# Patient Record
Sex: Male | Born: 1935 | Race: White | Hispanic: No | Marital: Married
Health system: Southern US, Community
[De-identification: ages and names within clinical notes are randomized; demographics above are authoritative.]

---

## 2008-08-26 ENCOUNTER — Ambulatory Visit: Payer: Self-pay | Admitting: Surgery

## 2008-09-01 ENCOUNTER — Ambulatory Visit: Payer: Self-pay | Admitting: Surgery

## 2008-09-10 ENCOUNTER — Ambulatory Visit: Payer: Self-pay | Admitting: Surgery

## 2017-01-12 ENCOUNTER — Inpatient Hospital Stay
Admission: RE | Admit: 2017-01-12 | Discharge: 2017-02-08 | Disposition: A | Discharge status: Skilled Nursing Facility | Payer: Medicare Other | Source: Ambulatory Visit | Attending: Interventional Radiology | Admitting: Interventional Radiology

## 2017-01-12 ENCOUNTER — Other Ambulatory Visit (HOSPITAL_COMMUNITY): Payer: Medicare Other

## 2017-01-12 DIAGNOSIS — L0291 Cutaneous abscess, unspecified: Secondary | ICD-10-CM

## 2017-01-12 DIAGNOSIS — Z9911 Dependence on respirator [ventilator] status: Secondary | ICD-10-CM

## 2017-01-12 DIAGNOSIS — K567 Ileus, unspecified: Secondary | ICD-10-CM

## 2017-01-12 DIAGNOSIS — Z930 Tracheostomy status: Secondary | ICD-10-CM

## 2017-01-12 DIAGNOSIS — Z4659 Encounter for fitting and adjustment of other gastrointestinal appliance and device: Secondary | ICD-10-CM

## 2017-01-12 DIAGNOSIS — IMO0002 Reserved for concepts with insufficient information to code with codable children: Secondary | ICD-10-CM

## 2017-01-12 DIAGNOSIS — J9690 Respiratory failure, unspecified, unspecified whether with hypoxia or hypercapnia: Secondary | ICD-10-CM

## 2017-01-13 LAB — BLOOD GAS, ARTERIAL
Acid-base deficit: 4.3 mmol/L — ABNORMAL HIGH (ref 0.0–2.0)
Bicarbonate: 20.2 mmol/L (ref 20.0–28.0)
FIO2: 30
O2 SAT: 98.3 %
PATIENT TEMPERATURE: 96.3
PCO2 ART: 34.6 mmHg (ref 32.0–48.0)
PEEP: 5 cmH2O
PH ART: 7.376 (ref 7.350–7.450)
RATE: 16 resp/min
VT: 500 mL
pO2, Arterial: 152 mmHg — ABNORMAL HIGH (ref 83.0–108.0)

## 2017-01-13 LAB — COMPREHENSIVE METABOLIC PANEL
ALBUMIN: 1.7 g/dL — AB (ref 3.5–5.0)
ALK PHOS: 125 U/L (ref 38–126)
ALT: 22 U/L (ref 17–63)
ANION GAP: 10 (ref 5–15)
AST: 29 U/L (ref 15–41)
BUN: 132 mg/dL — ABNORMAL HIGH (ref 6–20)
CALCIUM: 7.7 mg/dL — AB (ref 8.9–10.3)
CO2: 20 mmol/L — AB (ref 22–32)
Chloride: 117 mmol/L — ABNORMAL HIGH (ref 101–111)
Creatinine, Ser: 2.01 mg/dL — ABNORMAL HIGH (ref 0.61–1.24)
GFR calc Af Amer: 35 mL/min — ABNORMAL LOW (ref >60)
GFR calc non Af Amer: 30 mL/min — ABNORMAL LOW (ref >60)
GLUCOSE: 216 mg/dL — AB (ref 65–99)
POTASSIUM: 4.5 mmol/L (ref 3.5–5.1)
SODIUM: 147 mmol/L — AB (ref 135–145)
Total Bilirubin: 1 mg/dL (ref 0.3–1.2)
Total Protein: 5.3 g/dL — ABNORMAL LOW (ref 6.5–8.1)

## 2017-01-13 LAB — CBC WITH DIFFERENTIAL/PLATELET
Basophils Absolute: 0 10*3/uL (ref 0.0–0.1)
Basophils Relative: 0 %
Eosinophils Absolute: 0 10*3/uL (ref 0.0–0.7)
Eosinophils Relative: 0 %
HEMATOCRIT: 25.5 % — AB (ref 39.0–52.0)
Hemoglobin: 8.1 g/dL — ABNORMAL LOW (ref 13.0–17.0)
LYMPHS PCT: 13 %
Lymphs Abs: 1 10*3/uL (ref 0.7–4.0)
MCH: 30.7 pg (ref 26.0–34.0)
MCHC: 31.8 g/dL (ref 30.0–36.0)
MCV: 96.6 fL (ref 78.0–100.0)
MONO ABS: 0.4 10*3/uL (ref 0.1–1.0)
MONOS PCT: 5 %
NEUTROS ABS: 6.5 10*3/uL (ref 1.7–7.7)
Neutrophils Relative %: 82 %
Platelets: 241 10*3/uL (ref 150–400)
RBC: 2.64 MIL/uL — ABNORMAL LOW (ref 4.22–5.81)
RDW: 12.9 % (ref 11.5–15.5)
WBC: 7.9 10*3/uL (ref 4.0–10.5)

## 2017-01-13 LAB — PROTIME-INR
INR: 1.28
Prothrombin Time: 16.1 seconds — ABNORMAL HIGH (ref 11.4–15.2)

## 2017-01-14 ENCOUNTER — Institutional Professional Consult (permissible substitution) (HOSPITAL_COMMUNITY): Payer: Medicare Other

## 2017-01-16 ENCOUNTER — Other Ambulatory Visit (HOSPITAL_COMMUNITY): Payer: Medicare Other

## 2017-01-16 LAB — CBC WITH DIFFERENTIAL/PLATELET
BASOS PCT: 0 %
Basophils Absolute: 0 10*3/uL (ref 0.0–0.1)
EOS ABS: 0 10*3/uL (ref 0.0–0.7)
EOS PCT: 0 %
HCT: 25.5 % — ABNORMAL LOW (ref 39.0–52.0)
HEMOGLOBIN: 7.9 g/dL — AB (ref 13.0–17.0)
LYMPHS ABS: 1 10*3/uL (ref 0.7–4.0)
Lymphocytes Relative: 17 %
MCH: 30.4 pg (ref 26.0–34.0)
MCHC: 31 g/dL (ref 30.0–36.0)
MCV: 98.1 fL (ref 78.0–100.0)
MONO ABS: 0.4 10*3/uL (ref 0.1–1.0)
MONOS PCT: 6 %
Neutro Abs: 4.4 10*3/uL (ref 1.7–7.7)
Neutrophils Relative %: 77 %
Platelets: 214 10*3/uL (ref 150–400)
RBC: 2.6 MIL/uL — ABNORMAL LOW (ref 4.22–5.81)
RDW: 12.4 % (ref 11.5–15.5)
WBC: 5.8 10*3/uL (ref 4.0–10.5)

## 2017-01-16 LAB — BLOOD GAS, ARTERIAL
ACID-BASE EXCESS: 0.8 mmol/L (ref 0.0–2.0)
Bicarbonate: 25 mmol/L (ref 20.0–28.0)
FIO2: 30
MECHVT: 500 mL
O2 SAT: 97.5 %
PEEP/CPAP: 5 cmH2O
PH ART: 7.416 (ref 7.350–7.450)
Patient temperature: 96.9
RATE: 16 resp/min
pCO2 arterial: 39.3 mmHg (ref 32.0–48.0)
pO2, Arterial: 106 mmHg (ref 83.0–108.0)

## 2017-01-16 LAB — BASIC METABOLIC PANEL
Anion gap: 8 (ref 5–15)
BUN: 98 mg/dL — AB (ref 6–20)
CALCIUM: 7.8 mg/dL — AB (ref 8.9–10.3)
CHLORIDE: 119 mmol/L — AB (ref 101–111)
CO2: 26 mmol/L (ref 22–32)
CREATININE: 1.67 mg/dL — AB (ref 0.61–1.24)
GFR calc Af Amer: 43 mL/min — ABNORMAL LOW (ref >60)
GFR calc non Af Amer: 37 mL/min — ABNORMAL LOW (ref >60)
Glucose, Bld: 270 mg/dL — ABNORMAL HIGH (ref 65–99)
Potassium: 3.5 mmol/L (ref 3.5–5.1)
SODIUM: 153 mmol/L — AB (ref 135–145)

## 2017-01-16 LAB — PHOSPHORUS: Phosphorus: 4.7 mg/dL — ABNORMAL HIGH (ref 2.5–4.6)

## 2017-01-16 LAB — MAGNESIUM: MAGNESIUM: 2.4 mg/dL (ref 1.7–2.4)

## 2017-01-17 LAB — BLOOD GAS, ARTERIAL
ACID-BASE EXCESS: 4.9 mmol/L — AB (ref 0.0–2.0)
BICARBONATE: 29.4 mmol/L — AB (ref 20.0–28.0)
FIO2: 28
Mode: POSITIVE
O2 SAT: 96.2 %
PATIENT TEMPERATURE: 98.6
PCO2 ART: 47.2 mmHg (ref 32.0–48.0)
PEEP: 5 cmH2O
PH ART: 7.411 (ref 7.350–7.450)
PO2 ART: 91.7 mmHg (ref 83.0–108.0)
PRESSURE SUPPORT: 8 cmH2O

## 2017-01-17 LAB — RENAL FUNCTION PANEL
ALBUMIN: 1.9 g/dL — AB (ref 3.5–5.0)
ANION GAP: 10 (ref 5–15)
BUN: 88 mg/dL — ABNORMAL HIGH (ref 6–20)
CO2: 29 mmol/L (ref 22–32)
Calcium: 8.1 mg/dL — ABNORMAL LOW (ref 8.9–10.3)
Chloride: 116 mmol/L — ABNORMAL HIGH (ref 101–111)
Creatinine, Ser: 1.58 mg/dL — ABNORMAL HIGH (ref 0.61–1.24)
GFR calc non Af Amer: 40 mL/min — ABNORMAL LOW (ref >60)
GFR, EST AFRICAN AMERICAN: 46 mL/min — AB (ref >60)
GLUCOSE: 202 mg/dL — AB (ref 65–99)
POTASSIUM: 3.9 mmol/L (ref 3.5–5.1)
Phosphorus: 4 mg/dL (ref 2.5–4.6)
Sodium: 155 mmol/L — ABNORMAL HIGH (ref 135–145)

## 2017-01-17 LAB — CBC
HEMATOCRIT: 26.4 % — AB (ref 39.0–52.0)
HEMOGLOBIN: 8 g/dL — AB (ref 13.0–17.0)
MCH: 30 pg (ref 26.0–34.0)
MCHC: 30.3 g/dL (ref 30.0–36.0)
MCV: 98.9 fL (ref 78.0–100.0)
Platelets: 201 10*3/uL (ref 150–400)
RBC: 2.67 MIL/uL — ABNORMAL LOW (ref 4.22–5.81)
RDW: 12.7 % (ref 11.5–15.5)
WBC: 8.1 10*3/uL (ref 4.0–10.5)

## 2017-01-17 LAB — MAGNESIUM: Magnesium: 2.4 mg/dL (ref 1.7–2.4)

## 2017-01-18 LAB — BASIC METABOLIC PANEL
Anion gap: 9 (ref 5–15)
BUN: 80 mg/dL — ABNORMAL HIGH (ref 6–20)
CALCIUM: 7.6 mg/dL — AB (ref 8.9–10.3)
CO2: 31 mmol/L (ref 22–32)
Chloride: 108 mmol/L (ref 101–111)
Creatinine, Ser: 1.44 mg/dL — ABNORMAL HIGH (ref 0.61–1.24)
GFR calc Af Amer: 52 mL/min — ABNORMAL LOW (ref >60)
GFR, EST NON AFRICAN AMERICAN: 45 mL/min — AB (ref >60)
Glucose, Bld: 209 mg/dL — ABNORMAL HIGH (ref 65–99)
Potassium: 3.9 mmol/L (ref 3.5–5.1)
SODIUM: 148 mmol/L — AB (ref 135–145)

## 2017-01-24 ENCOUNTER — Other Ambulatory Visit (HOSPITAL_COMMUNITY): Payer: Medicare Other

## 2017-01-24 LAB — BASIC METABOLIC PANEL
ANION GAP: 11 (ref 5–15)
BUN: 82 mg/dL — AB (ref 6–20)
CHLORIDE: 98 mmol/L — AB (ref 101–111)
CO2: 27 mmol/L (ref 22–32)
Calcium: 7.7 mg/dL — ABNORMAL LOW (ref 8.9–10.3)
Creatinine, Ser: 1.91 mg/dL — ABNORMAL HIGH (ref 0.61–1.24)
GFR calc Af Amer: 37 mL/min — ABNORMAL LOW (ref >60)
GFR calc non Af Amer: 32 mL/min — ABNORMAL LOW (ref >60)
GLUCOSE: 195 mg/dL — AB (ref 65–99)
POTASSIUM: 3.5 mmol/L (ref 3.5–5.1)
Sodium: 136 mmol/L (ref 135–145)

## 2017-01-24 LAB — MAGNESIUM: Magnesium: 2.1 mg/dL (ref 1.7–2.4)

## 2017-01-24 LAB — CBC WITH DIFFERENTIAL/PLATELET
BASOS ABS: 0 10*3/uL (ref 0.0–0.1)
Basophils Relative: 0 %
EOS PCT: 0 %
Eosinophils Absolute: 0 10*3/uL (ref 0.0–0.7)
HEMATOCRIT: 26.2 % — AB (ref 39.0–52.0)
HEMOGLOBIN: 8.3 g/dL — AB (ref 13.0–17.0)
LYMPHS ABS: 0.8 10*3/uL (ref 0.7–4.0)
LYMPHS PCT: 4 %
MCH: 30.7 pg (ref 26.0–34.0)
MCHC: 31.7 g/dL (ref 30.0–36.0)
MCV: 97 fL (ref 78.0–100.0)
Monocytes Absolute: 2 10*3/uL — ABNORMAL HIGH (ref 0.1–1.0)
Monocytes Relative: 10 %
NEUTROS ABS: 16.7 10*3/uL — AB (ref 1.7–7.7)
Neutrophils Relative %: 86 %
PLATELETS: 254 10*3/uL (ref 150–400)
RBC: 2.7 MIL/uL — AB (ref 4.22–5.81)
RDW: 13.7 % (ref 11.5–15.5)
WBC: 19.4 10*3/uL — AB (ref 4.0–10.5)

## 2017-01-24 LAB — PHOSPHORUS: Phosphorus: 4.9 mg/dL — ABNORMAL HIGH (ref 2.5–4.6)

## 2017-01-25 LAB — URINALYSIS, ROUTINE W REFLEX MICROSCOPIC
Bilirubin Urine: NEGATIVE
Glucose, UA: NEGATIVE mg/dL
Hgb urine dipstick: NEGATIVE
Ketones, ur: NEGATIVE mg/dL
Leukocytes, UA: NEGATIVE
Nitrite: NEGATIVE
PH: 5 (ref 5.0–8.0)
Protein, ur: 30 mg/dL — AB
SPECIFIC GRAVITY, URINE: 1.01 (ref 1.005–1.030)

## 2017-01-25 LAB — CBC
HEMATOCRIT: 22.8 % — AB (ref 39.0–52.0)
HEMOGLOBIN: 7.3 g/dL — AB (ref 13.0–17.0)
MCH: 30.7 pg (ref 26.0–34.0)
MCHC: 32 g/dL (ref 30.0–36.0)
MCV: 95.8 fL (ref 78.0–100.0)
Platelets: 195 10*3/uL (ref 150–400)
RBC: 2.38 MIL/uL — AB (ref 4.22–5.81)
RDW: 14 % (ref 11.5–15.5)
WBC: 13.7 10*3/uL — ABNORMAL HIGH (ref 4.0–10.5)

## 2017-01-26 ENCOUNTER — Other Ambulatory Visit (HOSPITAL_COMMUNITY): Payer: Medicare Other

## 2017-01-26 LAB — URINE CULTURE

## 2017-01-26 LAB — CBC WITH DIFFERENTIAL/PLATELET
BASOS PCT: 0 %
Basophils Absolute: 0 10*3/uL (ref 0.0–0.1)
Eosinophils Absolute: 0 10*3/uL (ref 0.0–0.7)
Eosinophils Relative: 0 %
HEMATOCRIT: 25.6 % — AB (ref 39.0–52.0)
HEMOGLOBIN: 7.9 g/dL — AB (ref 13.0–17.0)
LYMPHS ABS: 0.7 10*3/uL (ref 0.7–4.0)
Lymphocytes Relative: 5 %
MCH: 30.3 pg (ref 26.0–34.0)
MCHC: 30.9 g/dL (ref 30.0–36.0)
MCV: 98.1 fL (ref 78.0–100.0)
MONOS PCT: 9 %
Monocytes Absolute: 1.3 10*3/uL — ABNORMAL HIGH (ref 0.1–1.0)
NEUTROS ABS: 12.2 10*3/uL — AB (ref 1.7–7.7)
NEUTROS PCT: 86 %
Platelets: 240 10*3/uL (ref 150–400)
RBC: 2.61 MIL/uL — ABNORMAL LOW (ref 4.22–5.81)
RDW: 13.5 % (ref 11.5–15.5)
WBC: 14.3 10*3/uL — ABNORMAL HIGH (ref 4.0–10.5)

## 2017-01-26 LAB — EXPECTORATED SPUTUM ASSESSMENT W GRAM STAIN, RFLX TO RESP C

## 2017-01-26 LAB — RENAL FUNCTION PANEL
Albumin: 1.5 g/dL — ABNORMAL LOW (ref 3.5–5.0)
Anion gap: 11 (ref 5–15)
BUN: 98 mg/dL — AB (ref 6–20)
CALCIUM: 7.3 mg/dL — AB (ref 8.9–10.3)
CO2: 26 mmol/L (ref 22–32)
CREATININE: 2.03 mg/dL — AB (ref 0.61–1.24)
Chloride: 100 mmol/L — ABNORMAL LOW (ref 101–111)
GFR calc non Af Amer: 29 mL/min — ABNORMAL LOW (ref >60)
GFR, EST AFRICAN AMERICAN: 34 mL/min — AB (ref >60)
Glucose, Bld: 178 mg/dL — ABNORMAL HIGH (ref 65–99)
Phosphorus: 5.6 mg/dL — ABNORMAL HIGH (ref 2.5–4.6)
Potassium: 4 mmol/L (ref 3.5–5.1)
SODIUM: 137 mmol/L (ref 135–145)

## 2017-01-26 LAB — MAGNESIUM: Magnesium: 2 mg/dL (ref 1.7–2.4)

## 2017-01-26 LAB — EXPECTORATED SPUTUM ASSESSMENT W REFEX TO RESP CULTURE

## 2017-01-27 LAB — RENAL FUNCTION PANEL
ANION GAP: 9 (ref 5–15)
Albumin: 1.5 g/dL — ABNORMAL LOW (ref 3.5–5.0)
BUN: 96 mg/dL — ABNORMAL HIGH (ref 6–20)
CHLORIDE: 99 mmol/L — AB (ref 101–111)
CO2: 29 mmol/L (ref 22–32)
Calcium: 7.4 mg/dL — ABNORMAL LOW (ref 8.9–10.3)
Creatinine, Ser: 2.03 mg/dL — ABNORMAL HIGH (ref 0.61–1.24)
GFR calc Af Amer: 34 mL/min — ABNORMAL LOW (ref >60)
GFR calc non Af Amer: 29 mL/min — ABNORMAL LOW (ref >60)
Glucose, Bld: 57 mg/dL — ABNORMAL LOW (ref 65–99)
POTASSIUM: 3.7 mmol/L (ref 3.5–5.1)
Phosphorus: 4.9 mg/dL — ABNORMAL HIGH (ref 2.5–4.6)
Sodium: 137 mmol/L (ref 135–145)

## 2017-01-27 LAB — CBC WITH DIFFERENTIAL/PLATELET
Basophils Absolute: 0 10*3/uL (ref 0.0–0.1)
Basophils Relative: 0 %
EOS ABS: 0.1 10*3/uL (ref 0.0–0.7)
Eosinophils Relative: 1 %
HEMATOCRIT: 25.2 % — AB (ref 39.0–52.0)
HEMOGLOBIN: 7.7 g/dL — AB (ref 13.0–17.0)
LYMPHS ABS: 0.6 10*3/uL — AB (ref 0.7–4.0)
LYMPHS PCT: 5 %
MCH: 29.7 pg (ref 26.0–34.0)
MCHC: 30.6 g/dL (ref 30.0–36.0)
MCV: 97.3 fL (ref 78.0–100.0)
MONOS PCT: 8 %
Monocytes Absolute: 0.8 10*3/uL (ref 0.1–1.0)
NEUTROS ABS: 9.4 10*3/uL — AB (ref 1.7–7.7)
NEUTROS PCT: 86 %
Platelets: 249 10*3/uL (ref 150–400)
RBC: 2.59 MIL/uL — ABNORMAL LOW (ref 4.22–5.81)
RDW: 13.4 % (ref 11.5–15.5)
WBC: 10.8 10*3/uL — ABNORMAL HIGH (ref 4.0–10.5)

## 2017-01-27 LAB — MAGNESIUM: Magnesium: 2.1 mg/dL (ref 1.7–2.4)

## 2017-01-27 NOTE — Consult Note (Signed)
Chief Complaint: Patient was seen in consultation today for abdominal abscess aspiration at the request of Dr Ardeth Sportsman  Referring Physician(s): Dr Ardeth Sportsman  Supervising Physician: Jolaine Click  Patient Status: [facility]                             [facility]  History of Present Illness: Manuel Spencer is a 82 y.o. male   Pt underwent lap appendectomy---to open surgical procedure 4 weeks ago for ruptured appendix Was discharged and did well at home for few days Developed incraesing abd pain; fever Returned to [facility] and was treated for infection Developed PNA and resp distress; sepsis Transferred to [facility] for management  And long term care CT yesterday: IMPRESSION: 1. Lack of IV contrast material diminishes specificity for assessing for abscess. There are 3 suspicious areas of localized fluid in the right hemiabdomen which may reflect multifocal fluid collections/abscess. 2. Persistent right pleural effusion and bibasilar atelectasis 3. Aortic atherosclerosis and infrarenal abdominal aortic ectasia. Ectatic abdominal aorta at risk for aneurysm development. Recommend followup by ultrasound in 5 years. This recommendation follows ACR consensus guidelines: White Paper of the ACR Incidental Findings Committee II on Vascular Findings. J Am Coll Radiol 2013; 10:789-794. 4. Gallstones.  Request for abscess aspiration Approved with Dr Bonnielee Haff Scheduled for 2/17  No past medical history on file.  No past surgical history on file.  Allergies: Patient has no allergy information on record.  Medications: Prior to Admission medications   Not on File     No family history on file.  Social History   Social History  . Marital status: Married    Spouse name: N/A  . Number of children: N/A  . Years of education: N/A   Social History Main Topics  . Smoking status: Not on file  . Smokeless tobacco: Not on file  . Alcohol use Not on file  . Drug  use: Unknown  . Sexual activity: Not on file   Other Topics Concern  . Not on file   Social History Narrative  . No narrative on file    Review of Systems: A 12 point ROS discussed and pertinent positives are indicated in the HPI above.  All other systems are negative.  Review of Systems  Constitutional: Positive for activity change, appetite change and fatigue. Negative for fever.  Respiratory: Negative for shortness of breath.   Neurological: Positive for weakness.  Psychiatric/Behavioral: Negative for behavioral problems.    Vital Signs: There were no vitals taken for this visit.  Physical Exam  Cardiovascular: Normal rate and regular rhythm.   Pulmonary/Chest: Effort normal and breath sounds normal.  Abdominal: Soft. Bowel sounds are normal. There is tenderness.  Musculoskeletal: Normal range of motion.  Neurological: He is alert.  Skin: Skin is warm and dry.  Psychiatric:  Consented with wife at bedside  Nursing note and vitals reviewed.   Mallampati Score:  MD Evaluation Airway: WNL Heart: WNL Abdomen: WNL Chest/ Lungs: WNL ASA  Classification: 3 Mallampati/Airway Score: Two  Imaging: Ct Abdomen Pelvis Wo Contrast  Result Date: 01/26/2017 CLINICAL DATA:  Evaluate for abscess.  Recent appendectomy. EXAM: CT ABDOMEN AND PELVIS WITHOUT CONTRAST TECHNIQUE: Multidetector CT imaging of the abdomen and pelvis was performed following the standard protocol without IV contrast. COMPARISON:  01/12/2017 FINDINGS: Lower chest: Persistent right pleural effusion. Atelectasis is identified within both lung bases. Hepatobiliary: No focal liver abnormality. Stones  are again noted within the gallbladder. The largest measures 1 cm, image 39 of series 2. No biliary dilatation. Pancreas: Unremarkable. No pancreatic ductal dilatation or surrounding inflammatory changes. Spleen: Normal in size without focal abnormality. Adrenals/Urinary Tract: Right adrenal gland appears normal. Left  adrenal nodule measures 1.5 cm, image 34 of series 2. Bilateral kidney cysts are noted which are incompletely characterized without IV contrast. No kidney stone or hydronephrosis. Bilateral renal cortical volume loss is noted. Urinary bladder is normal. Stomach/Bowel: The stomach is normal. The small bowel loops have a normal course and caliber without obstruction. No pathologic dilatation of the colon. Vascular/Lymphatic: Aortic atherosclerosis. Infrarenal abdominal aorta measures 2.6 cm, image 57 of series 2. There is no pelvic or inguinal adenopathy. Reproductive: Mild prostate gland enlargement. Other: There is what is suspected to represent loculated fluid along the undersurface of the right lobe of liver measuring 7.3 by 2.9 by 2.7 cm, image 41 of series 2. A second focus of loculated fluid is identified within the right hemiabdomen measuring 5.3 by 2 points 3 by 6.7 cm. Within the right lower quadrant of the abdomen there is a small focus of fluid measuring 2.7 cm. Musculoskeletal: Degenerative disc disease is identified within the lumbar spine. IMPRESSION: 1. Lack of IV contrast material diminishes specificity for assessing for abscess. There are 3 suspicious areas of localized fluid in the right hemiabdomen which may reflect multifocal fluid collections/abscess. 2. Persistent right pleural effusion and bibasilar atelectasis 3. Aortic atherosclerosis and infrarenal abdominal aortic ectasia. Ectatic abdominal aorta at risk for aneurysm development. Recommend followup by ultrasound in 5 years. This recommendation follows ACR consensus guidelines: White Paper of the ACR Incidental Findings Committee II on Vascular Findings. J Am Coll Radiol 2013; 10:789-794. 4. Gallstones. 5. These results will be called to the ordering clinician or representative by the Radiologist Assistant, and communication documented in the PACS or zVision Dashboard. Electronically Signed   By: Signa Kell M.D.   On: 01/26/2017 16:42    Dg Chest Port 1 View  Result Date: 01/16/2017 CLINICAL DATA:  Ventilator dependent respiratory failure following appendectomy. EXAM: PORTABLE CHEST 1 VIEW COMPARISON:  Portable chest x-ray of January 12, 2017 FINDINGS: The lungs are adequately inflated. The coarse bibasilar lung markings have improved. There is persistent trace left pleural effusion. The cardiac silhouette remains enlarged. The pulmonary vascularity is not engorged. There is calcification in the wall of the aortic arch. The endotracheal tube tip lies 4.1 cm above the carina. The esophagogastric tubes proximal port lies above the GE junction with the tip in the gastric cardia. The right internal jugular venous catheter tip projects over the midportion of the SVC. The bony thorax exhibits no acute abnormality. IMPRESSION: Decreasing bibasilar atelectasis. Persistent small left pleural effusion. Mild cardiomegaly without pulmonary edema. Thoracic aortic atherosclerosis. Advancement of the nasogastric tube by 10 cm is recommended to assure that the proximal port lies below the GE junction. Electronically Signed   By: David  [place] M.D.   On: 01/16/2017 08:05   Dg Chest Port 1 View  Result Date: 01/12/2017 CLINICAL DATA:  Intubated EXAM: PORTABLE CHEST 1 VIEW COMPARISON:  01/07/2017 chest radiograph. FINDINGS: Endotracheal tube tip is 4.3 cm above the carina. Enteric tube enters stomach with the tip not seen on this image. Right internal jugular central venous catheter terminates in the lower third of the superior vena cava. Stable cardiomediastinal silhouette with top-normal heart size and aortic atherosclerosis. No pneumothorax. Small left pleural effusion is stable. No residual right pleural  effusion. No overt pulmonary edema. Patchy bibasilar lung opacities, left greater than right, decreased bilaterally. IMPRESSION: 1. Well-positioned support structures as described. No pneumothorax . 2. Small left pleural effusion, stable. No residual right  pleural effusion. 3. Patchy bibasilar lung opacities, left greater than right, decreased bilaterally. 4. Aortic atherosclerosis. Electronically Signed   By: Delbert PhenixJason A Poff M.D.   On: 01/12/2017 19:23   Dg Abd Portable 1v  Result Date: 01/24/2017 CLINICAL DATA:  Abdominal distension EXAM: PORTABLE ABDOMEN - 1 VIEW COMPARISON:  01/14/2017 FINDINGS: Scattered large and small bowel gas is noted. No obstructive changes are seen. No free air is noted. No bony abnormality is seen. IMPRESSION: No acute abnormality noted. Electronically Signed   By: Alcide CleverMark  Lukens M.D.   On: 01/24/2017 13:22   Dg Abd Portable 1v  Result Date: 01/14/2017 CLINICAL DATA:  Follow up ileus.  Recent appendectomy. EXAM: PORTABLE ABDOMEN - 1 VIEW COMPARISON:  One view abdomen 01/12/2017.  CT 01/12/2017. FINDINGS: Enteric tube tip appears unchanged in the left upper quadrant. There is a small amount of residual contrast material within the colon which is not distended. No extraluminal contrast, bowel wall thickening or significant distention identified. Skin staples are noted from recent abdominal surgery. The bones appear unchanged. IMPRESSION: No active abdominal findings.  No significant bowel distension Electronically Signed   By: Carey BullocksWilliam  Veazey M.D.   On: 01/14/2017 08:39   Dg Abd Portable 1v  Result Date: 01/12/2017 CLINICAL DATA:  Enteric tube placement EXAM: PORTABLE ABDOMEN - 1 VIEW COMPARISON:  01/12/2017 CT abdomen/pelvis FINDINGS: Enteric tube terminates in the proximal stomach with the side port near the esophagogastric junction. No disproportionately dilated small bowel loops. Mild diffuse gaseous distention of the large bowel. Skin staples overlie the sacrum and left lower abdomen. No evidence of pneumatosis or pneumoperitoneum. Mild patchy left lung base opacity and small left pleural effusion. IMPRESSION: 1. Enteric tube terminates in the proximal stomach. 2. Mild diffuse gaseous distention of the large bowel, probably  representing a mild adynamic ileus. Electronically Signed   By: Delbert PhenixJason A Poff M.D.   On: 01/12/2017 19:25    Labs:  CBC:  Recent Labs  01/24/17 1207 01/25/17 0753 01/26/17 0628 01/27/17 0818  WBC 19.4* 13.7* 14.3* 10.8*  HGB 8.3* 7.3* 7.9* 7.7*  HCT 26.2* 22.8* 25.6* 25.2*  PLT 254 195 240 249    COAGS:  Recent Labs  01/13/17 0906  INR 1.28    BMP:  Recent Labs  01/18/17 0615 01/24/17 1207 01/26/17 0628 01/27/17 0818  NA 148* 136 137 137  K 3.9 3.5 4.0 3.7  CL 108 98* 100* 99*  CO2 31 27 26 29   GLUCOSE 209* 195* 178* 57*  BUN 80* 82* 98* 96*  CALCIUM 7.6* 7.7* 7.3* 7.4*  CREATININE 1.44* 1.91* 2.03* 2.03*  GFRNONAA 45* 32* 29* 29*  GFRAA 52* 37* 34* 34*    LIVER FUNCTION TESTS:  Recent Labs  01/13/17 0906 01/17/17 0600 01/26/17 0628 01/27/17 0818  BILITOT 1.0  --   --   --   AST 29  --   --   --   ALT 22  --   --   --   ALKPHOS 125  --   --   --   PROT 5.3*  --   --   --   ALBUMIN 1.7* 1.9* 1.5* 1.5*    TUMOR MARKERS: No results for input(s): AFPTM, CEA, CA199, CHROMGRNA in the last 8760 hours.  Assessment and Plan:  Appendectomy  for ruptured appendix 4 weeks ago Now with ongoing abd pain CT revealing 3 collections in abd Scheduled for aspiration in IR 2/17 Risks and Benefits discussed with the patient and wife including bleeding, infection, damage to adjacent structures, and sepsis. All of the patient's questions were answered, patient is agreeable to proceed. Consent signed and in chart.   Thank you for this interesting consult.  I greatly enjoyed meeting SAMUEL MCPEEK and look forward to participating in their care.  A copy of this report was sent to the requesting provider on this date.  Electronically Signed: Ralene Muskrat A 01/27/2017, 4:12 PM   I spent a total of 40 Minutes    in face to face in clinical consultation, greater than 50% of which was counseling/coordinating care for abdominal abscess aspiration

## 2017-01-28 ENCOUNTER — Encounter: Payer: Self-pay | Admitting: *Deleted

## 2017-01-28 ENCOUNTER — Other Ambulatory Visit (HOSPITAL_COMMUNITY): Payer: Medicare Other

## 2017-01-28 LAB — PROTIME-INR
INR: 1.14
Prothrombin Time: 14.6 seconds (ref 11.4–15.2)

## 2017-01-28 LAB — CBC
HCT: 24.5 % — ABNORMAL LOW (ref 39.0–52.0)
Hemoglobin: 7.6 g/dL — ABNORMAL LOW (ref 13.0–17.0)
MCH: 30.2 pg (ref 26.0–34.0)
MCHC: 31 g/dL (ref 30.0–36.0)
MCV: 97.2 fL (ref 78.0–100.0)
Platelets: 246 10*3/uL (ref 150–400)
RBC: 2.52 MIL/uL — AB (ref 4.22–5.81)
RDW: 13.6 % (ref 11.5–15.5)
WBC: 10 10*3/uL (ref 4.0–10.5)

## 2017-01-28 LAB — BASIC METABOLIC PANEL
Anion gap: 11 (ref 5–15)
BUN: 84 mg/dL — AB (ref 6–20)
CO2: 28 mmol/L (ref 22–32)
CREATININE: 1.79 mg/dL — AB (ref 0.61–1.24)
Calcium: 7.3 mg/dL — ABNORMAL LOW (ref 8.9–10.3)
Chloride: 99 mmol/L — ABNORMAL LOW (ref 101–111)
GFR calc Af Amer: 40 mL/min — ABNORMAL LOW (ref >60)
GFR calc non Af Amer: 34 mL/min — ABNORMAL LOW (ref >60)
GLUCOSE: 108 mg/dL — AB (ref 65–99)
POTASSIUM: 3.5 mmol/L (ref 3.5–5.1)
Sodium: 138 mmol/L (ref 135–145)

## 2017-01-28 LAB — URINALYSIS, ROUTINE W REFLEX MICROSCOPIC
BILIRUBIN URINE: NEGATIVE
GLUCOSE, UA: NEGATIVE mg/dL
KETONES UR: NEGATIVE mg/dL
LEUKOCYTES UA: NEGATIVE
Nitrite: NEGATIVE
PH: 5 (ref 5.0–8.0)
PROTEIN: NEGATIVE mg/dL
SQUAMOUS EPITHELIAL / LPF: NONE SEEN
Specific Gravity, Urine: 1.008 (ref 1.005–1.030)

## 2017-01-28 LAB — APTT: APTT: 47 s — AB (ref 24–36)

## 2017-01-28 LAB — MAGNESIUM: Magnesium: 2.1 mg/dL (ref 1.7–2.4)

## 2017-01-28 MED ORDER — SODIUM CHLORIDE 0.9 % IV SOLN
INTRAVENOUS | Status: AC | PRN
  Administered 2017-01-28: 10 mL/h via INTRAVENOUS

## 2017-01-28 MED ORDER — FENTANYL CITRATE (PF) 100 MCG/2ML IJ SOLN
INTRAMUSCULAR | Status: AC
  Filled 2017-01-28: qty 2

## 2017-01-28 MED ORDER — MIDAZOLAM HCL 2 MG/2ML IJ SOLN
INTRAMUSCULAR | Status: AC | PRN
  Administered 2017-01-28: 1 mg via INTRAVENOUS

## 2017-01-28 MED ORDER — FENTANYL CITRATE (PF) 100 MCG/2ML IJ SOLN
INTRAMUSCULAR | Status: AC | PRN
  Administered 2017-01-28 (×2): 25 ug via INTRAVENOUS

## 2017-01-28 MED ORDER — LIDOCAINE HCL 1 % IJ SOLN
INTRAMUSCULAR | Status: AC
  Filled 2017-01-28: qty 20

## 2017-01-28 MED ORDER — MIDAZOLAM HCL 2 MG/2ML IJ SOLN
INTRAMUSCULAR | Status: AC
  Filled 2017-01-28: qty 2

## 2017-01-28 NOTE — Sedation Documentation (Signed)
Transferred pt to 5E21 on CR monitor. Awake and alert. In no distress. Sa02 96-97%.

## 2017-01-28 NOTE — CV Procedure (Signed)
R abd fluid collection aspiration Clear fluid aspirated No comp/EBL

## 2017-01-29 LAB — CULTURE, RESPIRATORY

## 2017-01-29 LAB — URINE CULTURE

## 2017-01-29 LAB — CULTURE, RESPIRATORY W GRAM STAIN

## 2017-01-31 LAB — POTASSIUM: Potassium: 4 mmol/L (ref 3.5–5.1)

## 2017-01-31 LAB — BASIC METABOLIC PANEL
Anion gap: 8 (ref 5–15)
BUN: 49 mg/dL — ABNORMAL HIGH (ref 6–20)
CALCIUM: 7.3 mg/dL — AB (ref 8.9–10.3)
CHLORIDE: 102 mmol/L (ref 101–111)
CO2: 28 mmol/L (ref 22–32)
CREATININE: 1.43 mg/dL — AB (ref 0.61–1.24)
GFR calc non Af Amer: 45 mL/min — ABNORMAL LOW (ref >60)
GFR, EST AFRICAN AMERICAN: 52 mL/min — AB (ref >60)
GLUCOSE: 161 mg/dL — AB (ref 65–99)
Potassium: 3.4 mmol/L — ABNORMAL LOW (ref 3.5–5.1)
Sodium: 138 mmol/L (ref 135–145)

## 2017-01-31 LAB — URINE CULTURE

## 2017-02-02 LAB — AEROBIC/ANAEROBIC CULTURE W GRAM STAIN (SURGICAL/DEEP WOUND)
Culture: NO GROWTH
Gram Stain: NONE SEEN

## 2017-02-03 LAB — BASIC METABOLIC PANEL
ANION GAP: 7 (ref 5–15)
BUN: 36 mg/dL — ABNORMAL HIGH (ref 6–20)
CO2: 29 mmol/L (ref 22–32)
Calcium: 7.7 mg/dL — ABNORMAL LOW (ref 8.9–10.3)
Chloride: 102 mmol/L (ref 101–111)
Creatinine, Ser: 1.42 mg/dL — ABNORMAL HIGH (ref 0.61–1.24)
GFR calc Af Amer: 53 mL/min — ABNORMAL LOW (ref >60)
GFR, EST NON AFRICAN AMERICAN: 45 mL/min — AB (ref >60)
Glucose, Bld: 255 mg/dL — ABNORMAL HIGH (ref 65–99)
POTASSIUM: 4.6 mmol/L (ref 3.5–5.1)
SODIUM: 138 mmol/L (ref 135–145)

## 2017-02-03 LAB — CBC
HCT: 26 % — ABNORMAL LOW (ref 39.0–52.0)
Hemoglobin: 8 g/dL — ABNORMAL LOW (ref 13.0–17.0)
MCH: 30.2 pg (ref 26.0–34.0)
MCHC: 30.8 g/dL (ref 30.0–36.0)
MCV: 98.1 fL (ref 78.0–100.0)
PLATELETS: 226 10*3/uL (ref 150–400)
RBC: 2.65 MIL/uL — AB (ref 4.22–5.81)
RDW: 14.1 % (ref 11.5–15.5)
WBC: 14.8 10*3/uL — AB (ref 4.0–10.5)

## 2017-02-05 LAB — CBC
HCT: 26.6 % — ABNORMAL LOW (ref 39.0–52.0)
HEMOGLOBIN: 8.2 g/dL — AB (ref 13.0–17.0)
MCH: 30.1 pg (ref 26.0–34.0)
MCHC: 30.8 g/dL (ref 30.0–36.0)
MCV: 97.8 fL (ref 78.0–100.0)
Platelets: 228 10*3/uL (ref 150–400)
RBC: 2.72 MIL/uL — AB (ref 4.22–5.81)
RDW: 14.5 % (ref 11.5–15.5)
WBC: 13.6 10*3/uL — AB (ref 4.0–10.5)

## 2017-02-05 LAB — BASIC METABOLIC PANEL
ANION GAP: 6 (ref 5–15)
BUN: 31 mg/dL — ABNORMAL HIGH (ref 6–20)
CHLORIDE: 100 mmol/L — AB (ref 101–111)
CO2: 31 mmol/L (ref 22–32)
Calcium: 8 mg/dL — ABNORMAL LOW (ref 8.9–10.3)
Creatinine, Ser: 1.28 mg/dL — ABNORMAL HIGH (ref 0.61–1.24)
GFR calc non Af Amer: 52 mL/min — ABNORMAL LOW (ref >60)
GFR, EST AFRICAN AMERICAN: 60 mL/min — AB (ref >60)
Glucose, Bld: 228 mg/dL — ABNORMAL HIGH (ref 65–99)
POTASSIUM: 4.6 mmol/L (ref 3.5–5.1)
SODIUM: 137 mmol/L (ref 135–145)

## 2017-02-06 ENCOUNTER — Other Ambulatory Visit (HOSPITAL_COMMUNITY): Payer: Medicare Other

## 2017-02-07 LAB — RENAL FUNCTION PANEL
Albumin: 1.7 g/dL — ABNORMAL LOW (ref 3.5–5.0)
Anion gap: 9 (ref 5–15)
BUN: 31 mg/dL — ABNORMAL HIGH (ref 6–20)
CHLORIDE: 99 mmol/L — AB (ref 101–111)
CO2: 28 mmol/L (ref 22–32)
Calcium: 8.1 mg/dL — ABNORMAL LOW (ref 8.9–10.3)
Creatinine, Ser: 1.32 mg/dL — ABNORMAL HIGH (ref 0.61–1.24)
GFR calc Af Amer: 58 mL/min — ABNORMAL LOW (ref >60)
GFR, EST NON AFRICAN AMERICAN: 50 mL/min — AB (ref >60)
Glucose, Bld: 222 mg/dL — ABNORMAL HIGH (ref 65–99)
POTASSIUM: 4.9 mmol/L (ref 3.5–5.1)
Phosphorus: 4.2 mg/dL (ref 2.5–4.6)
Sodium: 136 mmol/L (ref 135–145)

## 2017-02-08 LAB — RENAL FUNCTION PANEL
ALBUMIN: 1.7 g/dL — AB (ref 3.5–5.0)
ANION GAP: 10 (ref 5–15)
BUN: 31 mg/dL — ABNORMAL HIGH (ref 6–20)
CO2: 26 mmol/L (ref 22–32)
Calcium: 7.9 mg/dL — ABNORMAL LOW (ref 8.9–10.3)
Chloride: 102 mmol/L (ref 101–111)
Creatinine, Ser: 1.38 mg/dL — ABNORMAL HIGH (ref 0.61–1.24)
GFR calc Af Amer: 55 mL/min — ABNORMAL LOW (ref >60)
GFR, EST NON AFRICAN AMERICAN: 47 mL/min — AB (ref >60)
Glucose, Bld: 176 mg/dL — ABNORMAL HIGH (ref 65–99)
PHOSPHORUS: 4 mg/dL (ref 2.5–4.6)
POTASSIUM: 4.4 mmol/L (ref 3.5–5.1)
Sodium: 138 mmol/L (ref 135–145)

## 2017-02-08 LAB — CBC WITH DIFFERENTIAL/PLATELET
BASOS ABS: 0 10*3/uL (ref 0.0–0.1)
Basophils Relative: 0 %
Eosinophils Absolute: 0.2 10*3/uL (ref 0.0–0.7)
Eosinophils Relative: 3 %
HEMATOCRIT: 25.6 % — AB (ref 39.0–52.0)
Hemoglobin: 7.8 g/dL — ABNORMAL LOW (ref 13.0–17.0)
LYMPHS PCT: 14 %
Lymphs Abs: 1.2 10*3/uL (ref 0.7–4.0)
MCH: 30 pg (ref 26.0–34.0)
MCHC: 30.5 g/dL (ref 30.0–36.0)
MCV: 98.5 fL (ref 78.0–100.0)
MONO ABS: 0.7 10*3/uL (ref 0.1–1.0)
Monocytes Relative: 8 %
NEUTROS ABS: 6.2 10*3/uL (ref 1.7–7.7)
Neutrophils Relative %: 75 %
Platelets: 220 10*3/uL (ref 150–400)
RBC: 2.6 MIL/uL — AB (ref 4.22–5.81)
RDW: 14.9 % (ref 11.5–15.5)
WBC: 8.3 10*3/uL (ref 4.0–10.5)

## 2017-02-08 LAB — MAGNESIUM: Magnesium: 1.8 mg/dL (ref 1.7–2.4)

## 2017-05-23 IMAGING — CT CT ABD-PELV W/O CM
2 of 4 series · 15 of 46 positions shown, 17 images · non-contrast
Comparison: 01/12/2017

CLINICAL DATA: Evaluate for abscess.  Recent appendectomy.

EXAM:
CT ABDOMEN AND PELVIS WITHOUT CONTRAST
TECHNIQUE: Multidetector CT imaging of the abdomen and pelvis was performed
following the standard protocol without IV contrast.

[Series 2: a/p w/o 5mm · axial · non-contrast · 0.89mm/px · z∈[+952,+1432]mm · 12 of 114 slices shown, 14 images]
[im 9/114  soft-tissue]
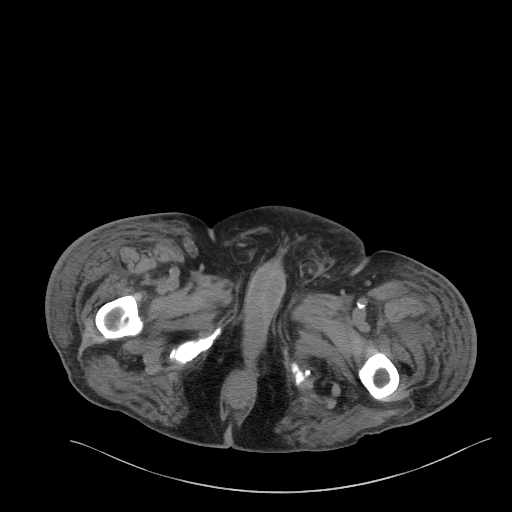
[im 9/114  bone]
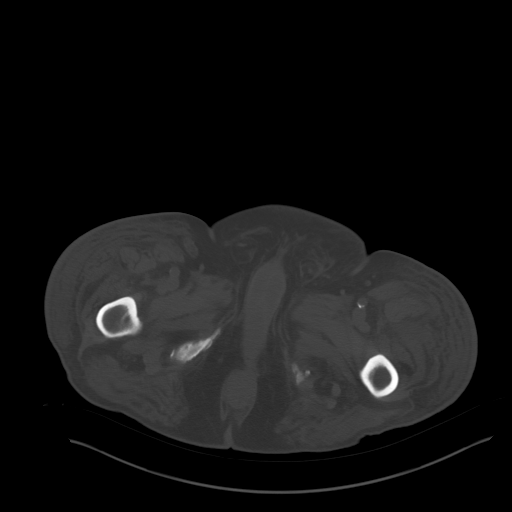
[im 18/114  soft-tissue]
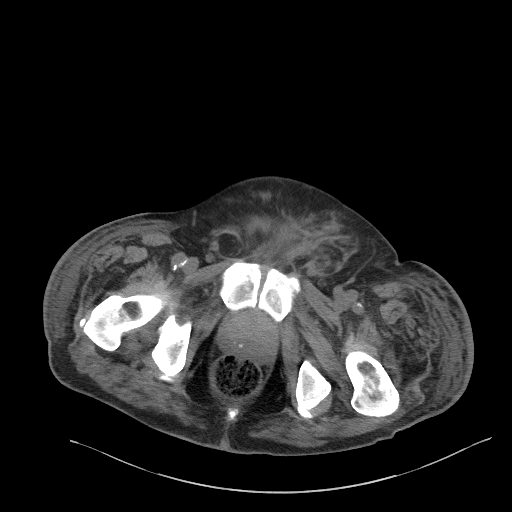
[im 27/114  soft-tissue]
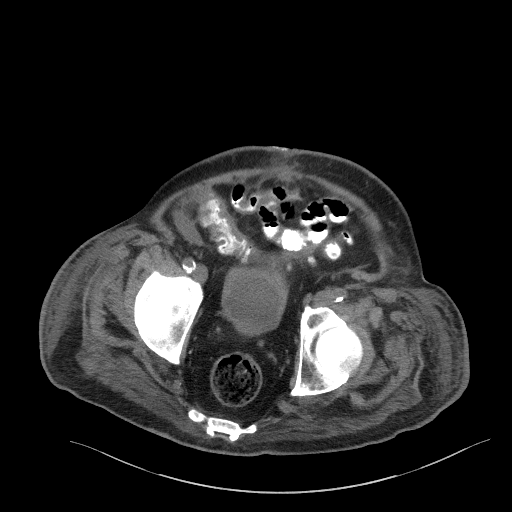
[im 35/114  soft-tissue]
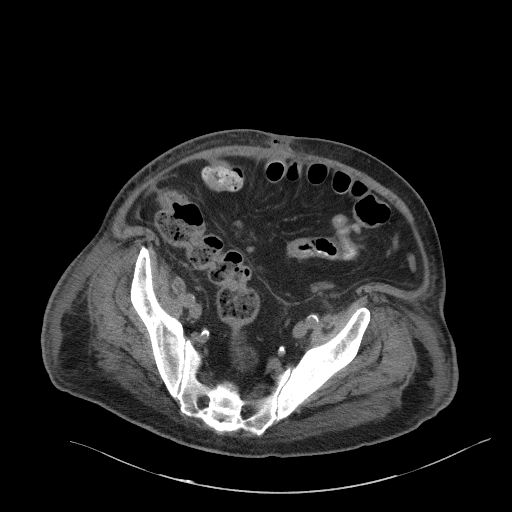
[im 44/114  soft-tissue]
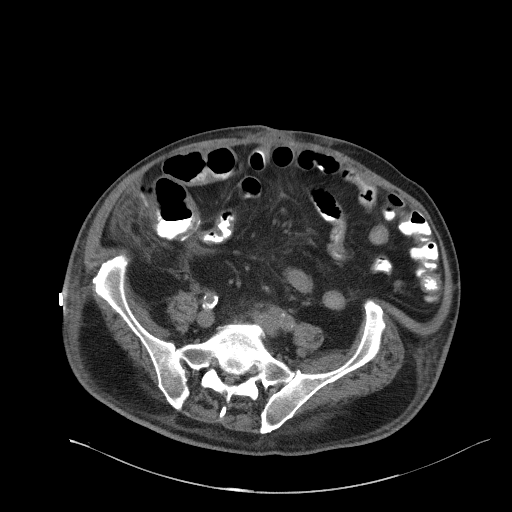
[im 53/114  soft-tissue]
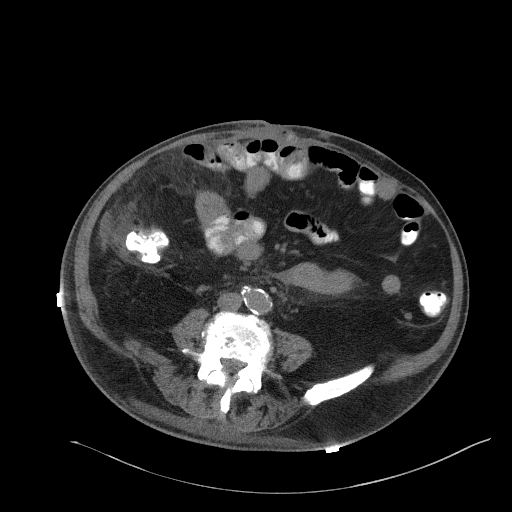
[im 61/114  soft-tissue]
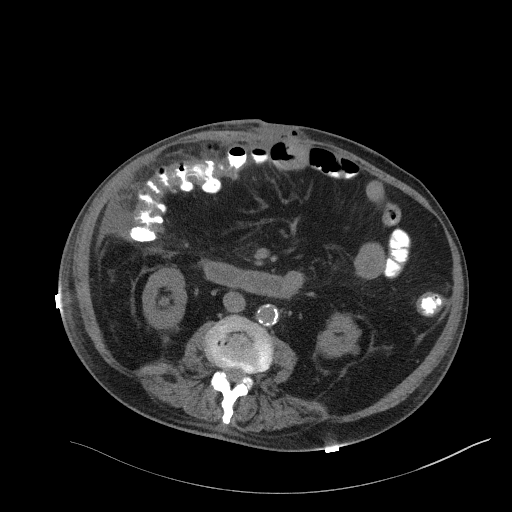
[im 70/114  soft-tissue]
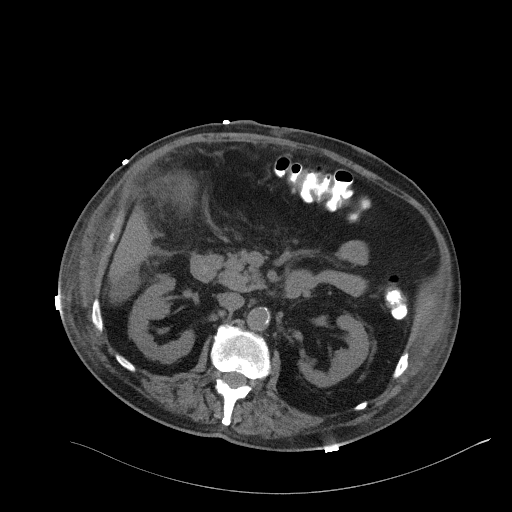
[im 79/114  soft-tissue]
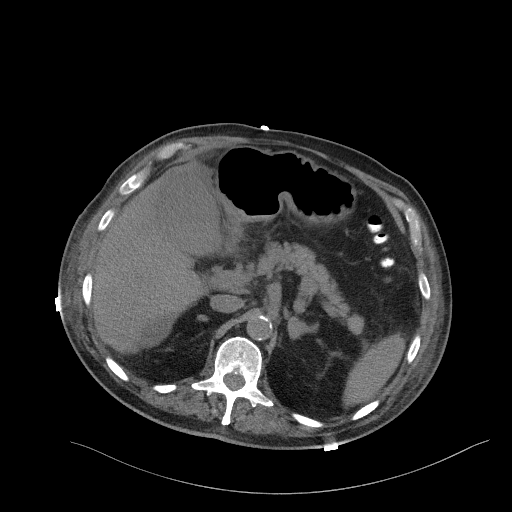
[im 79/114  bone]
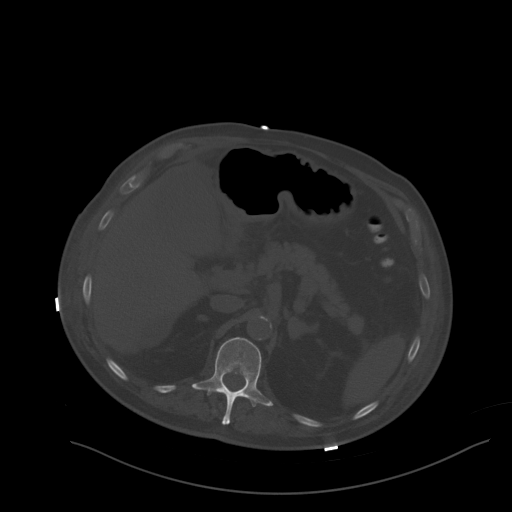
[im 87/114  soft-tissue]
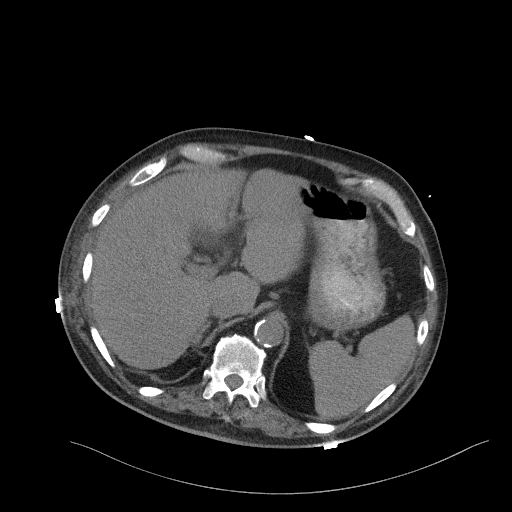
[im 96/114  soft-tissue]
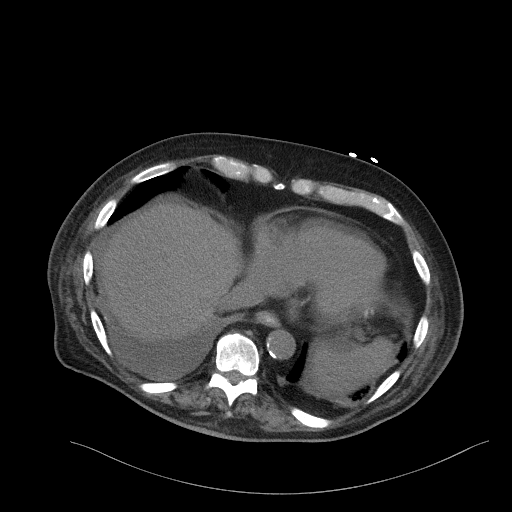
[im 105/114  soft-tissue]
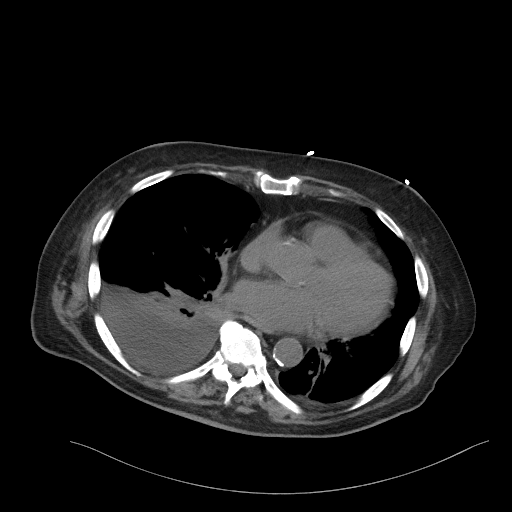

[Series 4: a/p w/o cor · coronal · non-contrast · 0.83mm/px · 3 of 145 slices shown]
[im 49/145  soft-tissue]
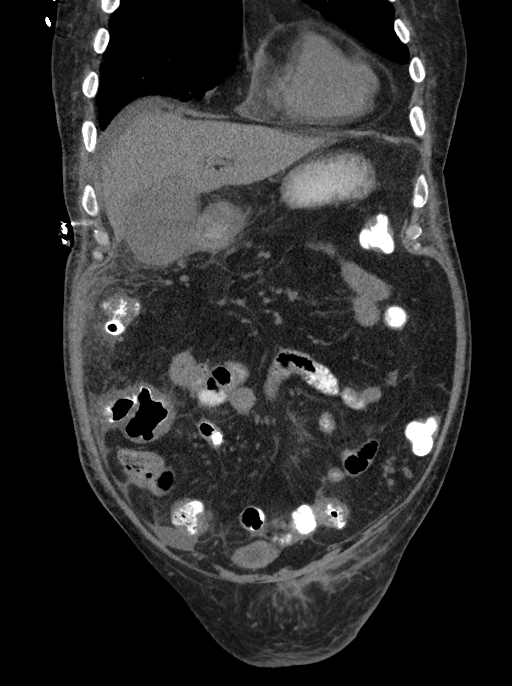
[im 65/145  soft-tissue]
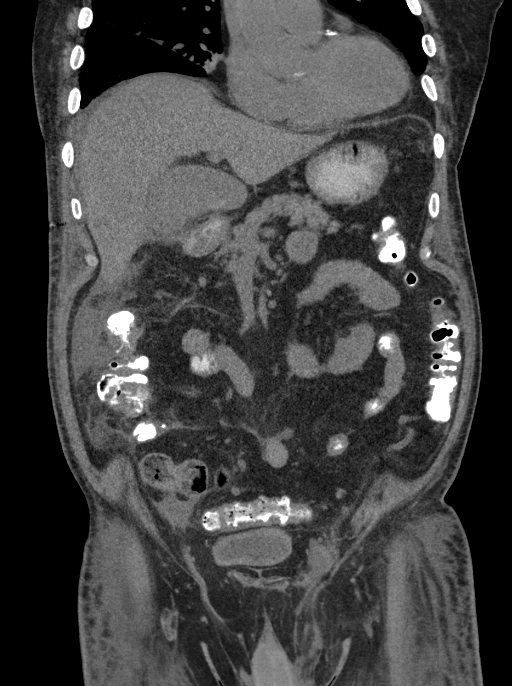
[im 81/145  soft-tissue]
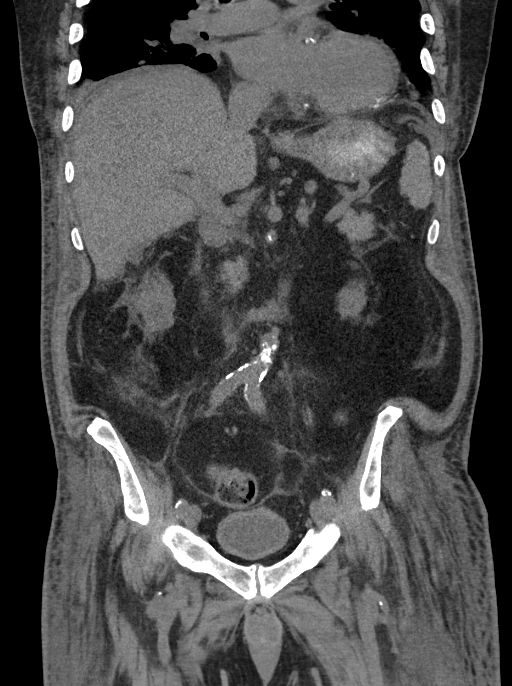

[15 of 46 positions shown; findings below may reference images not displayed]

FINDINGS: Lower chest: Persistent right pleural effusion. Atelectasis is
identified within both lung bases.

Hepatobiliary: No focal liver abnormality. Stones are again noted
within the gallbladder. The largest measures 1 cm, image 39 of
series 2. No biliary dilatation.

Pancreas: Unremarkable. No pancreatic ductal dilatation or
surrounding inflammatory changes.

Spleen: Normal in size without focal abnormality.

Adrenals/Urinary Tract: Right adrenal gland appears normal. Left
adrenal nodule measures 1.5 cm, image 34 of series 2. Bilateral
kidney cysts are noted which are incompletely characterized without
IV contrast. No kidney stone or hydronephrosis. Bilateral renal
cortical volume loss is noted. Urinary bladder is normal.

Stomach/Bowel: The stomach is normal. The small bowel loops have a
normal course and caliber without obstruction. No pathologic
dilatation of the colon.

Vascular/Lymphatic: Aortic atherosclerosis. Infrarenal abdominal
aorta measures 2.6 cm, image 57 of series 2. There is no pelvic or
inguinal adenopathy.

Reproductive: Mild prostate gland enlargement.

Other: There is what is suspected to represent loculated fluid along
the undersurface of the right lobe of liver measuring 7.3 by 2.9 by
2.7 cm, image 41 of series 2. A second focus of loculated fluid is
identified within the right hemiabdomen measuring 5.3 by 2 points 3
by 6.7 cm. Within the right lower quadrant of the abdomen there is a
small focus of fluid measuring 2.7 cm.

Musculoskeletal: Degenerative disc disease is identified within the
lumbar spine.
IMPRESSION: 1. Lack of IV contrast material diminishes specificity for assessing
for abscess. There are 3 suspicious areas of localized fluid in the
right hemiabdomen which may reflect multifocal fluid
collections/abscess.
2. Persistent right pleural effusion and bibasilar atelectasis
3. Aortic atherosclerosis and infrarenal abdominal aortic ectasia.
Ectatic abdominal aorta at risk for aneurysm development. Recommend
followup by ultrasound in 5 years. This recommendation follows ACR
consensus guidelines: White Paper of the ACR Incidental Findings
Committee II on Vascular Findings. [HOSPITAL] [id number];
[DATE].
4. Gallstones.
5. These results will be called to the ordering clinician or
representative by the Radiologist Assistant, and communication
documented in the PACS or zVision Dashboard.

## 2017-06-03 IMAGING — DX DG ABD PORTABLE 1V
1 series · 1 of 1 positions shown · non-contrast
Comparison: CT abdomen and pelvis 01/26/2017. Abdominal radiograph
01/24/2017.

CLINICAL DATA: Ileus.  Abdominal distention.

EXAM:
PORTABLE ABDOMEN - 1 VIEW

[abdomen kub]
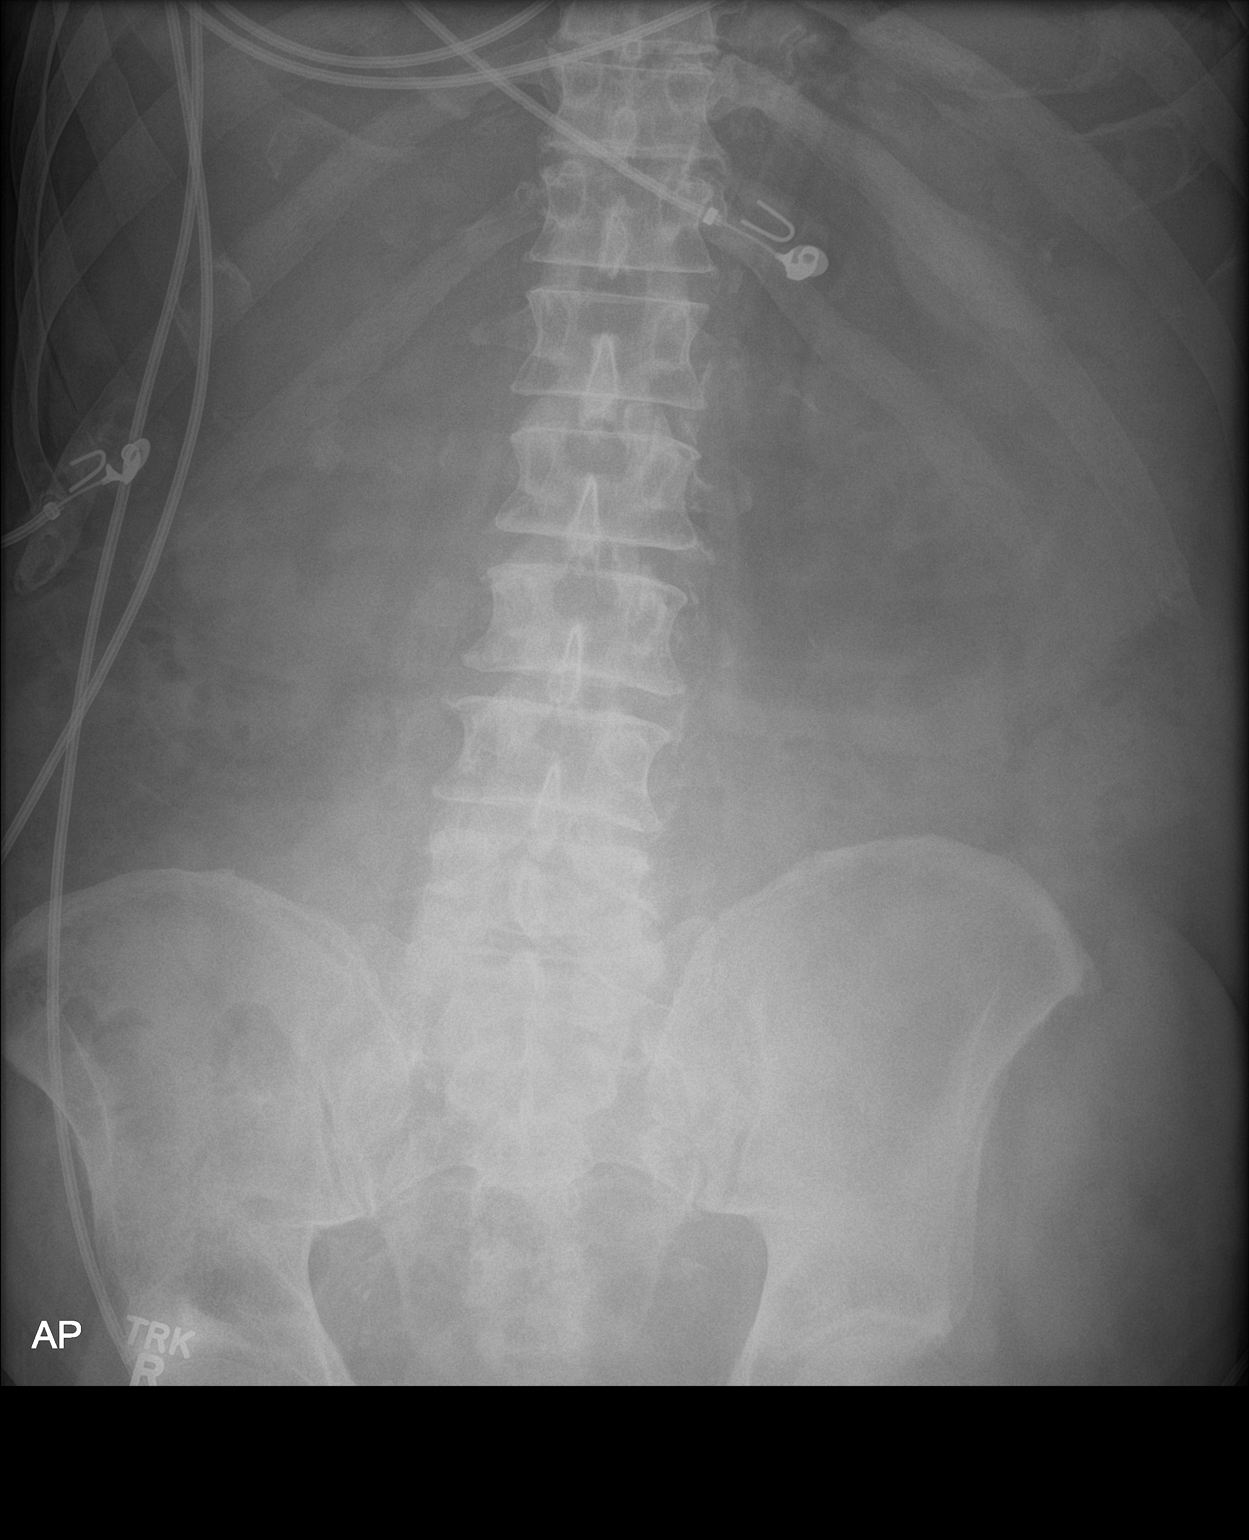

[1 of 1 positions shown; findings below may reference images not displayed]

FINDINGS: A small amount of gas is scattered in grossly nondilated loops of
bowel without evidence of obstruction. An approximately 1 cm
gallstone is again seen. No acute osseous abnormality is seen.
Vascular calcification is noted.
IMPRESSION: No evidence of bowel obstruction.

## 2023-11-12 DEATH — deceased
